# Patient Record
Sex: Male | Born: 1990 | Race: White | Hispanic: No | Marital: Single | Smoking: Light tobacco smoker
Health system: Southern US, Community
[De-identification: ages and names within clinical notes are randomized; demographics above are authoritative.]

## PROBLEM LIST (undated history)

## (undated) HISTORY — PX: APPENDECTOMY: SHX54

---
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
## 2013-02-03 ENCOUNTER — Ambulatory Visit (INDEPENDENT_AMBULATORY_CARE_PROVIDER_SITE_OTHER): Payer: BC Managed Care – PPO | Admitting: Family Medicine

## 2013-02-03 VITALS — BP 132/80 | HR 70 | Temp 98.4°F | Resp 12 | Ht 73.0 in | Wt 145.0 lb

## 2013-02-03 DIAGNOSIS — R51 Headache: Secondary | ICD-10-CM

## 2013-02-03 DIAGNOSIS — R238 Other skin changes: Secondary | ICD-10-CM

## 2013-02-03 DIAGNOSIS — L988 Other specified disorders of the skin and subcutaneous tissue: Secondary | ICD-10-CM

## 2013-02-03 MED ORDER — CEPHALEXIN 500 MG PO CAPS: 500.0000 mg | ORAL_CAPSULE | Freq: Two times a day (BID) | ORAL | Status: DC

## 2013-02-03 NOTE — Patient Instructions (Addendum)
Keep a band- aid on your chin as needed for bleeding. If you have bleeding beyond the next few hours please let me know. Try some hot compresses in case you develop any more pus.   Use the antibiotic as directed.  You can continue to use ibuprofen as needed.

## 2013-02-03 NOTE — Progress Notes (Signed)
[facility] [address], [place] [address] [phone]  Date:  02/03/2013   Name:  Kevin Chapman   DOB:  [birth year]   MRN:  [id number]  PCP:  No primary provider on file.    Chief Complaint: facial cyst   History of Present Illness:  Kevin Chapman is a 22 y.o. very pleasant male patient who presents with the following:  He has noted a very painful "cyst" on the left side of his chin for about 2 days.  It does seem to be getting larger.   He has not noted a fever.  Otherwise he is feeling ok, and is generally healthy.   He has never had this in the past.   He has tried ice and heat so far, but so far it has not drained.  He is taking ibuprofen 800mg  for the pain and inflammation He is sure that he had a tetanus shot in the last 10 years   There are no active problems to display for this patient.   History reviewed. No pertinent past medical history.  Past Surgical History  Procedure Laterality Date  . Appendectomy      History  Substance Use Topics  . Smoking status: Light Tobacco Smoker  . Smokeless tobacco: Not on file  . Alcohol Use: Yes    Family History  Problem Relation Age of Onset  . Stroke Maternal Grandmother   . Heart disease Paternal Grandfather     No Known Allergies  Medication list has been reviewed and updated.  No current outpatient prescriptions on file prior to visit.   No current facility-administered medications on file prior to visit.    Review of Systems:  As per HPI- otherwise negative.  Physical Examination: Filed Vitals:   02/03/13 1013  BP: 132/80  Pulse: 48  Temp: 98.4 F (36.9 C)  Resp: 12   Filed Vitals:   02/03/13 1013  Height: 6\' 1"  (1.854 m)  Weight: 145 lb (65.772 kg)   Body mass index is 19.13 kg/(m^2). Ideal Body Weight: Weight in (lb) to have BMI = 25: 189.1  GEN: WDWN, NAD, Non-toxic, A & O x 3, looks well HEENT: Atraumatic, Normocephalic. Neck supple. No masses, No LAD. Ears and  Nose: No external deformity. CV: RRR, No M/G/R. No JVD. No thrill. No extra heart sounds. PULM: CTA B, no wheezes, crackles, rhonchi. No retractions. No resp. distress. No accessory muscle use. EXTR: No c/c/e NEURO Normal gait.  PSYCH: Normally interactive. Conversant. Not depressed or anxious appearing.  Calm demeanor.  There is a tender, enlarged area on the left side of his chin, consistent with a large pimple/ small boil.  It is not draining,   VC obtained.  Area prepped with betadine and alcohol.  Anesthesia with a small amount of 1% lidocaine. I and D with 20 gauge needle  Pus expressed from pimple.  Bandage applied  Had him do 10 squats- pulse to 70 BPM.  No syncope or CP Assessment and Plan: Pain, face  Pimples - Plan: cephALEXin (KEFLEX) 500 MG capsule  Deep and painful pimple on chin.  Removed pus as above.  See patient instructions for more details.     Signed Abbe Amsterdam, MD

## 2013-02-04 ENCOUNTER — Telehealth: Payer: Self-pay

## 2013-02-04 MED ORDER — TRAMADOL HCL 50 MG PO TABS: 50.0000 mg | ORAL_TABLET | Freq: Three times a day (TID) | ORAL | Status: DC | PRN

## 2013-02-04 NOTE — Telephone Encounter (Signed)
Called him back- no answer at number listed and not his phone (I think it belongs to his GF but cannot be sure) Called his cell and LMOM. Will send some tramadol to drug store, be careful of sedation.  If not better in the next day or so please let me know

## 2013-02-04 NOTE — Telephone Encounter (Signed)
Patient would like something for pain from his recent visit. Was given antibiotics, but thinks he needs something for pain.   Best [phone]

## 2014-12-30 ENCOUNTER — Encounter (HOSPITAL_COMMUNITY): Payer: Self-pay | Admitting: Emergency Medicine

## 2014-12-30 ENCOUNTER — Emergency Department (HOSPITAL_COMMUNITY): Payer: BLUE CROSS/BLUE SHIELD

## 2014-12-30 ENCOUNTER — Emergency Department (HOSPITAL_COMMUNITY)
Admission: EM | Admit: 2014-12-30 | Discharge: 2014-12-30 | Disposition: A | Discharge status: Home / Self Care | Payer: BLUE CROSS/BLUE SHIELD | Attending: Emergency Medicine | Admitting: Emergency Medicine

## 2014-12-30 DIAGNOSIS — Y998 Other external cause status: Secondary | ICD-10-CM | POA: Diagnosis not present

## 2014-12-30 DIAGNOSIS — S66821A Laceration of other specified muscles, fascia and tendons at wrist and hand level, right hand, initial encounter: Secondary | ICD-10-CM | POA: Diagnosis not present

## 2014-12-30 DIAGNOSIS — Y9389 Activity, other specified: Secondary | ICD-10-CM | POA: Insufficient documentation

## 2014-12-30 DIAGNOSIS — Z720 Tobacco use: Secondary | ICD-10-CM | POA: Diagnosis not present

## 2014-12-30 DIAGNOSIS — Z23 Encounter for immunization: Secondary | ICD-10-CM | POA: Diagnosis not present

## 2014-12-30 DIAGNOSIS — S61411A Laceration without foreign body of right hand, initial encounter: Secondary | ICD-10-CM | POA: Diagnosis present

## 2014-12-30 DIAGNOSIS — Y9289 Other specified places as the place of occurrence of the external cause: Secondary | ICD-10-CM | POA: Diagnosis not present

## 2014-12-30 DIAGNOSIS — W25XXXA Contact with sharp glass, initial encounter: Secondary | ICD-10-CM | POA: Diagnosis not present

## 2014-12-30 MED ORDER — LIDOCAINE HCL 1 % IJ SOLN: 30.0000 mL | Freq: Once | INTRAMUSCULAR | Status: DC

## 2014-12-30 MED ORDER — LIDOCAINE-EPINEPHRINE 1 %-1:100000 IJ SOLN
INTRAMUSCULAR | Status: AC
  Administered 2014-12-30: 1 mL
  Filled 2014-12-30: qty 1

## 2014-12-30 MED ORDER — NAPROXEN 500 MG PO TABS: 500.0000 mg | ORAL_TABLET | Freq: Two times a day (BID) | ORAL | Status: DC

## 2014-12-30 MED ORDER — LIDOCAINE HCL (PF) 1 % IJ SOLN: 30.0000 mL | Freq: Once | INTRAMUSCULAR | Status: DC

## 2014-12-30 MED ORDER — AMOXICILLIN-POT CLAVULANATE 875-125 MG PO TABS: 1.0000 | ORAL_TABLET | Freq: Two times a day (BID) | ORAL | Status: DC

## 2014-12-30 MED ORDER — BACITRACIN ZINC 500 UNIT/GM EX OINT
TOPICAL_OINTMENT | CUTANEOUS | Status: AC
  Administered 2014-12-30: 18:00:00
  Filled 2014-12-30: qty 0.9

## 2014-12-30 MED ORDER — TETANUS-DIPHTH-ACELL PERTUSSIS 5-2.5-18.5 LF-MCG/0.5 IM SUSP
0.5000 mL | Freq: Once | INTRAMUSCULAR | Status: AC
  Administered 2014-12-30: 0.5 mL via INTRAMUSCULAR
  Filled 2014-12-30: qty 0.5

## 2014-12-30 MED ORDER — LIDOCAINE-EPINEPHRINE 1 %-1:100000 IJ SOLN
10.0000 mL | Freq: Once | INTRAMUSCULAR | Status: AC
  Administered 2014-12-30: 1 mL

## 2014-12-30 NOTE — ED Notes (Signed)
MD at bedside. 

## 2014-12-30 NOTE — Discharge Instructions (Signed)
Please call your doctor for a followup appointment within 24-48 hours. When you talk to your doctor please let them know that you were seen in the emergency department and have them acquire all of your records so that they can discuss the findings with you and formulate a treatment plan to fully care for your new and ongoing problems. ° °

## 2014-12-30 NOTE — ED Notes (Signed)
Pt c/o laceration to right hand, large amount of skin pulled away from hand, silvery tendon-like tissue visible through wound. Pt states he fumbled with a door and accidentally sent his hand through the glass. Pt has vagal reaction at times to blood involving syncope and seizure.

## 2014-12-30 NOTE — ED Provider Notes (Signed)
CSN: [id number]     Arrival date & time 12/30/14  1614 History   First MD Initiated Contact with Patient 12/30/14 1633     Chief Complaint  Patient presents with  . Extremity Laceration  . Exposed Tendon      (Consider location/radiation/quality/duration/timing/severity/associated sxs/prior Treatment) HPI Comments: Pt presents after having acute injury to the dorsum of the R hand when his hand went through a glass window.  This was acute in onset, constant pain and bleeding- laceration over the dorsum of the hand - pressure to control bleeding, no numbness or weakness.  Sx are mild at this time.  The history is provided by the patient.    History reviewed. No pertinent past medical history. Past Surgical History  Procedure Laterality Date  . Appendectomy     Family History  Problem Relation Age of Onset  . Stroke Maternal Grandmother   . Heart disease Paternal Grandfather    History  Substance Use Topics  . Smoking status: Light Tobacco Smoker  . Smokeless tobacco: Not on file  . Alcohol Use: Yes    Review of Systems  Constitutional: Negative for fever.  Gastrointestinal: Negative for vomiting.  Skin: Positive for wound.       Laceration  Neurological: Negative for weakness and numbness.      Allergies  Review of patient's allergies indicates no known allergies.  Home Medications   Prior to Admission medications   Medication Sig Start Date End Date Taking? Authorizing Provider  Multiple Vitamins-Minerals (AIRBORNE GUMMIES PO) Take 1 tablet by mouth daily as needed (nutrition).   Yes Historical Provider, MD  amoxicillin-clavulanate (AUGMENTIN) 875-125 MG per tablet Take 1 tablet by mouth every 12 (twelve) hours. 12/30/14   Eber Hong, MD  naproxen (NAPROSYN) 500 MG tablet Take 1 tablet (500 mg total) by mouth 2 (two) times daily with a meal. 12/30/14   Eber Hong, MD   BP 115/69 mmHg  Pulse 75  Temp(Src) 97.4 F (36.3 C) (Oral)  Resp 11  SpO2 99% Physical  Exam  Constitutional: He appears well-developed and well-nourished.  HENT:  Head: Normocephalic and atraumatic.  Eyes: Conjunctivae are normal. Right eye exhibits no discharge. Left eye exhibits no discharge.  Pulmonary/Chest: Effort normal. No respiratory distress.  Musculoskeletal:  No weakness to flextion or extension of the 2nd digit on the R.  Neurological: He is alert. Coordination normal.  Normal sensation and ROM with resistance - no defecits.  Skin: Skin is warm and dry. No rash noted. He is not diaphoretic. No erythema.  Laceration as below with tendon exposed - extensor tendon of teh 2nd digit on the dorsum  - small frayed edge but 99% intact  Psychiatric: He has a normal mood and affect.  Nursing note and vitals reviewed.   ED Course  Procedures (including critical care time) Labs Review Labs Reviewed - No data to display  Imaging Review Dg Hand Complete Right  12/30/2014   CLINICAL DATA:  Multiple lacerations today.  EXAM: RIGHT HAND - COMPLETE 3+ VIEW  COMPARISON:  None.  FINDINGS: The joint spaces are maintained. No acute fracture is identified. No radiopaque foreign bodies.  IMPRESSION: No acute bony findings or radiopaque foreign body.   Electronically Signed   By: Rudie Meyer M.D.   On: 12/30/2014 17:22      MDM   Final diagnoses:  Laceration of hand, right, complicated, initial encounter    Consult with hand with exposed tendon - the flap of skin is very thin -  bleeding controlled, no numbness or weakness.  Update tdap.    LACERATION REPAIR Performed by: Vida RollerMILLER,Vonzell Lindblad D Authorized by: Vida RollerMILLER,Sakoya Win D Consent: Verbal consent obtained. Risks and benefits: risks, benefits and alternatives were discussed Consent given by: patient Patient identity confirmed: provided demographic data Prepped and Draped in normal sterile fashion Wound explored  Laceration Location: R hand  Laceration Length: 8cm  No Foreign Bodies seen or palpated  Anesthesia: local  infiltration  Local anesthetic: lidocaine 1% with epinephrine  Anesthetic total: 4 ml  Irrigation method: syringe Amount of cleaning: standard  Skin closure: 5-0 prolene  Number of sutures: 13  Technique: simple interrupted  Patient tolerance: Patient tolerated the procedure well with no immediate complications.   LACERATION REPAIR Performed by: Vida RollerMILLER,Reiley Bertagnolli D Authorized by: Vida RollerMILLER,Shanina Kepple D Consent: Verbal consent obtained. Risks and benefits: risks, benefits and alternatives were discussed Consent given by: patient Patient identity confirmed: provided demographic data Prepped and Draped in normal sterile fashion Wound explored  Laceration Location: R hand  Laceration Length: 1.5cm  No Foreign Bodies seen or palpated  Anesthesia: local infiltration  Local anesthetic: lidocaine 1% with epinephrine  Anesthetic total: 1 ml  Irrigation method: syringe Amount of cleaning: standard  Skin closure: 5-0 prolene  Number of sutures: 5  Technique: Simple interrupted  Patient tolerance: Patient tolerated the procedure well with no immediate complications.   Wounds were all explored in bloodless field - no FB's on xray or direct visualization  D/w Dr. Janee Mornhompson who will arrange to see pt on Monday - wound care, splint and instructions for pt - he has expressed his understanding.  Eber HongBrian Liann Spaeth, MD 12/30/14 [id number]

## 2016-08-27 ENCOUNTER — Ambulatory Visit (INDEPENDENT_AMBULATORY_CARE_PROVIDER_SITE_OTHER): Payer: BLUE CROSS/BLUE SHIELD | Admitting: Family Medicine

## 2016-08-27 VITALS — BP 122/58 | HR 64 | Temp 98.3°F | Ht 73.0 in | Wt 142.0 lb

## 2016-08-27 DIAGNOSIS — H109 Unspecified conjunctivitis: Secondary | ICD-10-CM | POA: Diagnosis not present

## 2016-08-27 MED ORDER — CIPROFLOXACIN HCL 0.3 % OP SOLN
OPHTHALMIC | 0 refills | Status: AC
Start: 2016-08-27 — End: ?

## 2016-08-27 NOTE — Patient Instructions (Addendum)
You appear to have bacterial conjunctivitis. Start antibiotic drops as discussed, one drop every 2 hours to each eye for the next 2 days, then can space out to 1 drop every 4 hours for the following 5 days.  You can also pick up Zaditor over-the-counter which can help with allergic conjunctivitis if you have persistent eye itching in the future. If that does not help, then can meet with eye care provider.   Return to the clinic or go to the nearest emergency room if any of your symptoms worsen or new symptoms occur.   Bacterial Conjunctivitis Bacterial conjunctivitis is an infection of the clear membrane that covers the white part of your eye and the inner surface of your eyelid (conjunctiva). When the blood vessels in your conjunctiva become inflamed, your eye becomes red or pink, and it will probably feel itchy. Bacterial conjunctivitis spreads very easily from person to person (is contagious). It also spreads easily from one eye to the other eye. What are the causes? This condition is caused by several common bacteria. You may get the infection if you come into close contact with another person who is infected. You may also come into contact with items that are contaminated with the bacteria, such as a face towel, contact lens solution, or eye makeup. What increases the risk? This condition is more likely to develop in people who:  Are exposed to other people who have the infection.  Wear contact lenses.  Have a sinus infection.  Have had a recent eye injury or surgery.  Have a weak body defense system (immune system).  Have a medical condition that causes dry eyes. What are the signs or symptoms? Symptoms of this condition include:  Eye redness.  Tearing or watery eyes.  Itchy eyes.  Burning feeling in your eyes.  Thick, yellowish discharge from an eye. This may turn into a crust on the eyelid overnight and cause your eyelids to stick together.  Swollen eyelids.  Blurred  vision. How is this diagnosed? Your health care provider can diagnose this condition based on your symptoms and medical history. Your health care provider may also take a sample of discharge from your eye to find the cause of your infection. This is rarely done. How is this treated? Treatment for this condition includes:  Antibiotic eye drops or ointment to clear the infection more quickly and prevent the spread of infection to others.  Oral antibiotic medicines to treat infections that do not respond to drops or ointments, or last longer than 10 days.  Cool, wet cloths (cool compresses) placed on the eyes.  Artificial tears applied 2-6 times a day. Follow these instructions at home: Medicines  Take or apply your antibiotic medicine as told by your health care provider. Do not stop taking or applying the antibiotic even if you start to feel better.  Take or apply over-the-counter and prescription medicines only as told by your health care provider.  Be very careful to avoid touching the edge of your eyelid with the eye drop bottle or the ointment tube when you apply medicines to the affected eye. This will keep you from spreading the infection to your other eye or to other people. Managing discomfort  Gently wipe away any drainage from your eye with a warm, wet washcloth or a cotton ball.  Apply a cool, clean washcloth to your eye for 10-20 minutes, 3-4 times a day. General instructions  Do not wear contact lenses until the inflammation is gone and  your health care provider says it is safe to wear them again. Ask your health care provider how to sterilize or replace your contact lenses before you use them again. Wear glasses until you can resume wearing contacts.  Avoid wearing eye makeup until the inflammation is gone. Throw away any old eye cosmetics that may be contaminated.  Change or wash your pillowcase every day.  Do not share towels or washcloths. This may spread the  infection.  Wash your hands often with soap and water. Use paper towels to dry your hands.  Avoid touching or rubbing your eyes.  Do not drive or use heavy machinery if your vision is blurred. Contact a health care provider if:  You have a fever.  Your symptoms do not get better after 10 days. Get help right away if:  You have a fever and your symptoms suddenly get worse.  You have severe pain when you move your eye.  You have facial pain, redness, or swelling.  You have sudden loss of vision. This information is not intended to replace advice given to you by your health care provider. Make sure you discuss any questions you have with your health care provider. Document Released: 07/16/2005 Document Revised: 11/24/2015 Document Reviewed: 04/28/2015 Elsevier Interactive Patient Education  2017 [employer]    IF you received an x-ray today, you will receive an invoice from [facility]. Please contact [facility] at [phone] with questions or concerns regarding your invoice.   IF you received labwork today, you will receive an invoice from [facility]. Please contact [facility] at [phone] with questions or concerns regarding your invoice.   Our billing staff will not be able to assist you with questions regarding bills from these companies.  You will be contacted with the lab results as soon as they are available. The fastest way to get your results is to activate your My Chart account. Instructions are located on the last page of this paperwork. If you have not heard from us regarding the results in 2 weeks, please contact this office.

## 2016-08-27 NOTE — Addendum Note (Signed)
Addended by: Meredith StaggersGREENE, Delise Simenson R on: 08/27/2016 09:03 AM   Modules accepted: Level of Service
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
## 2018-09-18 ENCOUNTER — Other Ambulatory Visit: Payer: Self-pay

## 2018-09-18 ENCOUNTER — Encounter (HOSPITAL_COMMUNITY): Payer: Self-pay | Admitting: Emergency Medicine

## 2018-09-18 ENCOUNTER — Ambulatory Visit (HOSPITAL_COMMUNITY)
Admission: EM | Admit: 2018-09-18 | Discharge: 2018-09-18 | Disposition: A | Discharge status: Home / Self Care | Payer: BLUE CROSS/BLUE SHIELD | Attending: Internal Medicine | Admitting: Internal Medicine

## 2018-09-18 DIAGNOSIS — S01511A Laceration without foreign body of lip, initial encounter: Secondary | ICD-10-CM

## 2018-09-18 MED ORDER — ACETAMINOPHEN 500 MG PO TABS
500.0000 mg | ORAL_TABLET | Freq: Four times a day (QID) | ORAL | Status: AC | PRN
Start: 2018-09-18 — End: ?

## 2018-09-18 MED ORDER — LIDOCAINE HCL (PF) 1 % IJ SOLN
INTRAMUSCULAR | Status: AC
  Filled 2018-09-18: qty 30

## 2018-09-18 NOTE — ED Triage Notes (Signed)
PT fell last night at 1am and struck face on the edge of a table. PT chipped front tooth and has a cut on lower lip. Nose is also sore.

## 2018-09-18 NOTE — ED Provider Notes (Signed)
[facility]    CSN: [id number] Arrival date & time: 09/18/18  1233     History   Chief Complaint Chief Complaint  Patient presents with  . Fall  . Lip Laceration    HPI Kevin Chapman is a 28 y.o. male with no past medical history comes to the urgent care with laceration on the lower lip.  He sustained a fall yesterday.  Fall was mechanical fall.  Pain is severe without any relieving factors.  Aggravated by palpation.  Associated with swelling of the lower lip.  No numbness or tingling over the lower jaw.  HPI  History reviewed. No pertinent past medical history.  There are no active problems to display for this patient.   Past Surgical History:  Procedure Laterality Date  . APPENDECTOMY         Home Medications    Prior to Admission medications   Medication Sig Start Date End Date Taking? Authorizing Provider  ciprofloxacin (CILOXAN) 0.3 % ophthalmic solution Administer 1 drop to each eye every 2 hours, while awake, for 2 days. Then 1 drop, every 4 hours, while awake, for the next 5 days. 08/27/16   Shade Flood, MD  Multiple Vitamins-Minerals (AIRBORNE GUMMIES PO) Take 1 tablet by mouth daily as needed (nutrition).    [provider]    Family History Family History  Problem Relation Age of Onset  . Stroke Maternal Grandmother   . Heart disease Paternal Grandfather     Social History Social History   Tobacco Use  . Smoking status: Light Tobacco Smoker  . Smokeless tobacco: Never Used  Substance Use Topics  . Alcohol use: Yes  . Drug use: No     Allergies   Patient has no known allergies.   Review of Systems Review of Systems  Constitutional: Positive for appetite change. Negative for activity change.  HENT: Positive for dental problem. Negative for hearing loss, mouth sores, nosebleeds and rhinorrhea.   Eyes: Negative for pain, discharge and itching.  Gastrointestinal: Negative for abdominal distention and abdominal pain.    Musculoskeletal: Negative for arthralgias, gait problem and joint swelling.  Neurological: Positive for dizziness. Negative for syncope, weakness, light-headedness, numbness and headaches.     Physical Exam Triage Vital Signs ED Triage Vitals  Enc Vitals Group     BP 09/18/18 1244 139/81     Pulse Rate 09/18/18 1244 78     Resp 09/18/18 1244 18     Temp 09/18/18 1244 98.1 F (36.7 C)     Temp Source 09/18/18 1244 Oral     SpO2 09/18/18 1244 100 %     Weight --      Height --      Head Circumference --      Peak Flow --      Pain Score 09/18/18 1249 7     Pain Loc --      Pain Edu? --      Excl. in GC? --    No data found.  Updated Vital Signs BP 139/81 (BP Location: Left Arm)   Pulse 78   Temp 98.1 F (36.7 C) (Oral)   Resp 18   SpO2 100%   Visual Acuity Right Eye Distance:   Left Eye Distance:   Bilateral Distance:    Right Eye Near:   Left Eye Near:    Bilateral Near:     Physical Exam Constitutional:      General: He is in acute distress.  Appearance: Normal appearance. He is not toxic-appearing.  HENT:     Mouth/Throat:     Comments: Lower lip laceration with dried blood.  Laceration is fairly superficial and is currently well approximated. Neck:     Musculoskeletal: Normal range of motion and neck supple.  Pulmonary:     Effort: Pulmonary effort is normal. No respiratory distress.     Breath sounds: Normal breath sounds. No wheezing.  Skin:    Capillary Refill: Capillary refill takes less than 2 seconds.  Neurological:     General: No focal deficit present.     Mental Status: He is alert.      UC Treatments / Results  Labs (all labs ordered are listed, but only abnormal results are displayed) Labs Reviewed - No data to display  EKG None  Radiology No results found.  Procedures Procedures (including critical care time)  Medications Ordered in UC Medications - No data to display  Initial Impression / Assessment and Plan / UC  Course  I have reviewed the triage vital signs and the nursing notes.  Pertinent labs & imaging results that were available during my care of the patient were reviewed by me and considered in my medical decision making (see chart for details).     1. Laceration over the lower lip: Lidocaine injection given Vigorous cleaning of the dried blood trauma area. No indication for suturing at this time Patient to follow-up with [place] in a week for wound recheck. Final Clinical Impressions(s) / UC Diagnoses   Final diagnoses:  None   Discharge Instructions   None    ED Prescriptions    None     Controlled Substance Prescriptions [place] Controlled Substance Registry consulted? No   Merrilee Jansky, MD 09/18/18 1359

## 2019-02-15 ENCOUNTER — Emergency Department (HOSPITAL_COMMUNITY): Payer: Self-pay

## 2019-02-15 ENCOUNTER — Other Ambulatory Visit: Payer: Self-pay

## 2019-02-15 ENCOUNTER — Emergency Department (HOSPITAL_COMMUNITY)
Admission: EM | Admit: 2019-02-15 | Discharge: 2019-02-15 | Disposition: A | Discharge status: Home / Self Care | Payer: Self-pay | Attending: Emergency Medicine | Admitting: Emergency Medicine

## 2019-02-15 DIAGNOSIS — T148XXA Other injury of unspecified body region, initial encounter: Secondary | ICD-10-CM

## 2019-02-15 DIAGNOSIS — Z720 Tobacco use: Secondary | ICD-10-CM | POA: Insufficient documentation

## 2019-02-15 DIAGNOSIS — Y9355 Activity, bike riding: Secondary | ICD-10-CM | POA: Insufficient documentation

## 2019-02-15 DIAGNOSIS — Y929 Unspecified place or not applicable: Secondary | ICD-10-CM | POA: Insufficient documentation

## 2019-02-15 DIAGNOSIS — Z23 Encounter for immunization: Secondary | ICD-10-CM | POA: Insufficient documentation

## 2019-02-15 DIAGNOSIS — Y999 Unspecified external cause status: Secondary | ICD-10-CM | POA: Insufficient documentation

## 2019-02-15 DIAGNOSIS — S7002XA Contusion of left hip, initial encounter: Secondary | ICD-10-CM | POA: Insufficient documentation

## 2019-02-15 DIAGNOSIS — S30810A Abrasion of lower back and pelvis, initial encounter: Secondary | ICD-10-CM | POA: Insufficient documentation

## 2019-02-15 MED ORDER — NAPROXEN 500 MG PO TABS: 500.0000 mg | ORAL_TABLET | Freq: Two times a day (BID) | ORAL | 0 refills | Status: AC | PRN

## 2019-02-15 MED ORDER — HYDROCODONE-ACETAMINOPHEN 5-325 MG PO TABS
1.0000 | ORAL_TABLET | Freq: Once | ORAL | Status: AC | PRN
  Administered 2019-02-15: 1 via ORAL
  Filled 2019-02-15: qty 1

## 2019-02-15 MED ORDER — TETANUS-DIPHTH-ACELL PERTUSSIS 5-2.5-18.5 LF-MCG/0.5 IM SUSP
0.5000 mL | Freq: Once | INTRAMUSCULAR | Status: AC
  Administered 2019-02-15: 0.5 mL via INTRAMUSCULAR
  Filled 2019-02-15: qty 0.5

## 2019-02-15 MED ORDER — IBUPROFEN 800 MG PO TABS
800.0000 mg | ORAL_TABLET | Freq: Once | ORAL | Status: AC | PRN
  Administered 2019-02-15: 800 mg via ORAL
  Filled 2019-02-15: qty 1

## 2019-02-15 MED ORDER — BACITRACIN ZINC 500 UNIT/GM EX OINT: 1.0000 "application " | TOPICAL_OINTMENT | Freq: Two times a day (BID) | CUTANEOUS | 0 refills | Status: AC

## 2019-02-15 MED ORDER — METHOCARBAMOL 500 MG PO TABS: 500.0000 mg | ORAL_TABLET | Freq: Two times a day (BID) | ORAL | 0 refills | Status: AC | PRN

## 2019-02-15 NOTE — Discharge Instructions (Signed)
It was my pleasure taking care of you today!  Keep wounds clean and dry. Apply topical antibiotic ointment once or twice a day.   Naproxen as needed for pain. Robaxin is your muscle relaxer to take as needed.   Return to ER for new or worsening symptoms, any additional concerns.

## 2019-02-15 NOTE — ED Notes (Signed)
Patient verbalizes understanding of discharge instructions. Opportunity for questioning and answers were provided. Armband removed by staff, pt discharged from ED.  
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
## 2019-02-15 NOTE — ED Notes (Signed)
Patient transported to X-ray 
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
